# Patient Record
Sex: Male | Born: 1985 | Race: Black or African American | Hispanic: No | Marital: Single | Smoking: Current every day smoker
Health system: Southern US, Community
[De-identification: ages and names within clinical notes are randomized; demographics above are authoritative.]

## PROBLEM LIST (undated history)

## (undated) DIAGNOSIS — J45909 Unspecified asthma, uncomplicated: Secondary | ICD-10-CM

## (undated) DIAGNOSIS — G40909 Epilepsy, unspecified, not intractable, without status epilepticus: Secondary | ICD-10-CM

---

## 2006-12-09 ENCOUNTER — Emergency Department: Payer: Self-pay | Admitting: Emergency Medicine

## 2007-11-11 ENCOUNTER — Emergency Department: Payer: Self-pay | Admitting: Emergency Medicine

## 2017-09-30 ENCOUNTER — Other Ambulatory Visit: Payer: Self-pay

## 2017-09-30 ENCOUNTER — Emergency Department: Payer: Self-pay

## 2017-09-30 ENCOUNTER — Ambulatory Visit
Admission: EM | Admit: 2017-09-30 | Discharge: 2017-09-30 | Disposition: A | Discharge status: Other Acute Inpatient Hospital | Payer: Self-pay | Attending: Family Medicine | Admitting: Family Medicine

## 2017-09-30 ENCOUNTER — Encounter: Payer: Self-pay | Admitting: Emergency Medicine

## 2017-09-30 ENCOUNTER — Inpatient Hospital Stay
Admission: EM | Admit: 2017-09-30 | Discharge: 2017-10-02 | DRG: 202 | Disposition: A | Discharge status: Home / Self Care | Payer: Self-pay | Attending: Internal Medicine | Admitting: Internal Medicine

## 2017-09-30 DIAGNOSIS — F17210 Nicotine dependence, cigarettes, uncomplicated: Secondary | ICD-10-CM | POA: Diagnosis present

## 2017-09-30 DIAGNOSIS — Z9114 Patient's other noncompliance with medication regimen: Secondary | ICD-10-CM

## 2017-09-30 DIAGNOSIS — J9601 Acute respiratory failure with hypoxia: Secondary | ICD-10-CM | POA: Diagnosis present

## 2017-09-30 DIAGNOSIS — J4521 Mild intermittent asthma with (acute) exacerbation: Secondary | ICD-10-CM

## 2017-09-30 DIAGNOSIS — J45901 Unspecified asthma with (acute) exacerbation: Secondary | ICD-10-CM | POA: Diagnosis present

## 2017-09-30 DIAGNOSIS — G40909 Epilepsy, unspecified, not intractable, without status epilepticus: Secondary | ICD-10-CM | POA: Diagnosis present

## 2017-09-30 DIAGNOSIS — J189 Pneumonia, unspecified organism: Secondary | ICD-10-CM

## 2017-09-30 DIAGNOSIS — J4542 Moderate persistent asthma with status asthmaticus: Principal | ICD-10-CM | POA: Diagnosis present

## 2017-09-30 HISTORY — DX: Unspecified asthma, uncomplicated: J45.909

## 2017-09-30 HISTORY — DX: Epilepsy, unspecified, not intractable, without status epilepticus: G40.909

## 2017-09-30 LAB — CBC WITH DIFFERENTIAL/PLATELET
BASOS PCT: 1 %
Basophils Absolute: 0.1 10*3/uL (ref 0–0.1)
Eosinophils Absolute: 0.1 10*3/uL (ref 0–0.7)
Eosinophils Relative: 1 %
HEMATOCRIT: 44.4 % (ref 40.0–52.0)
HEMOGLOBIN: 14.8 g/dL (ref 13.0–18.0)
LYMPHS ABS: 1.1 10*3/uL (ref 1.0–3.6)
LYMPHS PCT: 9 %
MCH: 30.5 pg (ref 26.0–34.0)
MCHC: 33.4 g/dL (ref 32.0–36.0)
MCV: 91.3 fL (ref 80.0–100.0)
MONO ABS: 1 10*3/uL (ref 0.2–1.0)
MONOS PCT: 9 %
NEUTROS ABS: 9.9 10*3/uL — AB (ref 1.4–6.5)
NEUTROS PCT: 80 %
Platelets: 247 10*3/uL (ref 150–440)
RBC: 4.87 MIL/uL (ref 4.40–5.90)
RDW: 12.5 % (ref 11.5–14.5)
WBC: 12.2 10*3/uL — ABNORMAL HIGH (ref 3.8–10.6)

## 2017-09-30 LAB — COMPREHENSIVE METABOLIC PANEL
ALBUMIN: 4.6 g/dL (ref 3.5–5.0)
ALT: 30 U/L (ref 17–63)
ANION GAP: 9 (ref 5–15)
AST: 32 U/L (ref 15–41)
Alkaline Phosphatase: 85 U/L (ref 38–126)
BUN: 12 mg/dL (ref 6–20)
CHLORIDE: 98 mmol/L — AB (ref 101–111)
CO2: 28 mmol/L (ref 22–32)
Calcium: 9.1 mg/dL (ref 8.9–10.3)
Creatinine, Ser: 1.04 mg/dL (ref 0.61–1.24)
GFR calc non Af Amer: >60 mL/min (ref >60)
GLUCOSE: 148 mg/dL — AB (ref 65–99)
POTASSIUM: 3.7 mmol/L (ref 3.5–5.1)
Sodium: 135 mmol/L (ref 135–145)
Total Bilirubin: 0.6 mg/dL (ref 0.3–1.2)
Total Protein: 8.5 g/dL — ABNORMAL HIGH (ref 6.5–8.1)

## 2017-09-30 LAB — CBC
HCT: 40.5 % (ref 40.0–52.0)
Hemoglobin: 14.2 g/dL (ref 13.0–18.0)
MCH: 31.7 pg (ref 26.0–34.0)
MCHC: 35 g/dL (ref 32.0–36.0)
MCV: 90.5 fL (ref 80.0–100.0)
PLATELETS: 212 10*3/uL (ref 150–440)
RBC: 4.47 MIL/uL (ref 4.40–5.90)
RDW: 12.3 % (ref 11.5–14.5)
WBC: 11.1 10*3/uL — ABNORMAL HIGH (ref 3.8–10.6)

## 2017-09-30 LAB — CREATININE, SERUM
Creatinine, Ser: 0.94 mg/dL (ref 0.61–1.24)
GFR calc Af Amer: >60 mL/min (ref >60)
GFR calc non Af Amer: >60 mL/min (ref >60)

## 2017-09-30 MED ORDER — BUDESONIDE 0.5 MG/2ML IN SUSP
0.5000 mg | Freq: Two times a day (BID) | RESPIRATORY_TRACT | Status: DC
  Administered 2017-09-30 – 2017-10-02 (×4): 0.5 mg via RESPIRATORY_TRACT
  Filled 2017-09-30 (×4): qty 2

## 2017-09-30 MED ORDER — BENZONATATE 100 MG PO CAPS: 100.0000 mg | ORAL_CAPSULE | Freq: Three times a day (TID) | ORAL | Status: DC | PRN

## 2017-09-30 MED ORDER — IPRATROPIUM-ALBUTEROL 0.5-2.5 (3) MG/3ML IN SOLN
3.0000 mL | Freq: Four times a day (QID) | RESPIRATORY_TRACT | Status: DC
  Administered 2017-09-30: 3 mL via RESPIRATORY_TRACT

## 2017-09-30 MED ORDER — SODIUM CHLORIDE 0.9 % IV BOLUS
1000.0000 mL | Freq: Once | INTRAVENOUS | Status: AC
  Administered 2017-09-30: 1000 mL via INTRAVENOUS

## 2017-09-30 MED ORDER — MAGNESIUM SULFATE 2 GM/50ML IV SOLN
2.0000 g | Freq: Once | INTRAVENOUS | Status: AC
  Administered 2017-09-30: 2 g via INTRAVENOUS

## 2017-09-30 MED ORDER — ORAL CARE MOUTH RINSE
15.0000 mL | Freq: Two times a day (BID) | OROMUCOSAL | Status: DC
  Administered 2017-09-30 – 2017-10-01 (×3): 15 mL via OROMUCOSAL

## 2017-09-30 MED ORDER — ACETAMINOPHEN 650 MG RE SUPP: 650.0000 mg | Freq: Four times a day (QID) | RECTAL | Status: DC | PRN

## 2017-09-30 MED ORDER — ONDANSETRON HCL 4 MG PO TABS: 4.0000 mg | ORAL_TABLET | Freq: Four times a day (QID) | ORAL | Status: DC | PRN

## 2017-09-30 MED ORDER — AZITHROMYCIN 500 MG PO TABS
500.0000 mg | ORAL_TABLET | Freq: Once | ORAL | Status: AC
  Administered 2017-09-30: 500 mg via ORAL
  Filled 2017-09-30: qty 1

## 2017-09-30 MED ORDER — METHYLPREDNISOLONE SODIUM SUCC 40 MG IJ SOLR: 40.0000 mg | Freq: Four times a day (QID) | INTRAMUSCULAR | Status: DC

## 2017-09-30 MED ORDER — ACETAMINOPHEN 325 MG PO TABS: 650.0000 mg | ORAL_TABLET | Freq: Four times a day (QID) | ORAL | Status: DC | PRN

## 2017-09-30 MED ORDER — ACETAMINOPHEN 500 MG PO TABS
1000.0000 mg | ORAL_TABLET | Freq: Once | ORAL | Status: AC
  Administered 2017-09-30: 1000 mg via ORAL
  Filled 2017-09-30: qty 2

## 2017-09-30 MED ORDER — ALBUTEROL SULFATE (2.5 MG/3ML) 0.083% IN NEBU: 5.0000 mg | INHALATION_SOLUTION | Freq: Once | RESPIRATORY_TRACT | Status: DC

## 2017-09-30 MED ORDER — ONDANSETRON HCL 4 MG/2ML IJ SOLN: 4.0000 mg | Freq: Four times a day (QID) | INTRAMUSCULAR | Status: DC | PRN

## 2017-09-30 MED ORDER — METHYLPREDNISOLONE SODIUM SUCC 125 MG IJ SOLR
125.0000 mg | Freq: Once | INTRAMUSCULAR | Status: AC
  Administered 2017-09-30: 125 mg via INTRAVENOUS
  Filled 2017-09-30: qty 2

## 2017-09-30 MED ORDER — IPRATROPIUM-ALBUTEROL 0.5-2.5 (3) MG/3ML IN SOLN
3.0000 mL | Freq: Four times a day (QID) | RESPIRATORY_TRACT | Status: DC
  Administered 2017-09-30 – 2017-10-01 (×4): 3 mL via RESPIRATORY_TRACT
  Filled 2017-09-30 (×4): qty 3

## 2017-09-30 MED ORDER — IPRATROPIUM-ALBUTEROL 0.5-2.5 (3) MG/3ML IN SOLN
3.0000 mL | Freq: Once | RESPIRATORY_TRACT | Status: AC
  Administered 2017-09-30: 3 mL via RESPIRATORY_TRACT

## 2017-09-30 MED ORDER — NICOTINE 21 MG/24HR TD PT24
21.0000 mg | MEDICATED_PATCH | Freq: Every day | TRANSDERMAL | Status: DC
  Administered 2017-09-30 – 2017-10-02 (×3): 21 mg via TRANSDERMAL
  Filled 2017-09-30 (×4): qty 1

## 2017-09-30 MED ORDER — ALBUTEROL SULFATE (2.5 MG/3ML) 0.083% IN NEBU
10.0000 mg/h | INHALATION_SOLUTION | Freq: Once | RESPIRATORY_TRACT | Status: DC
  Filled 2017-09-30: qty 3

## 2017-09-30 MED ORDER — ALBUTEROL SULFATE (2.5 MG/3ML) 0.083% IN NEBU
2.5000 mg | INHALATION_SOLUTION | Freq: Once | RESPIRATORY_TRACT | Status: AC
  Administered 2017-09-30: 2.5 mg via RESPIRATORY_TRACT

## 2017-09-30 MED ORDER — METHYLPREDNISOLONE SODIUM SUCC 40 MG IJ SOLR
40.0000 mg | Freq: Four times a day (QID) | INTRAMUSCULAR | Status: DC
  Administered 2017-09-30 – 2017-10-01 (×4): 40 mg via INTRAVENOUS
  Filled 2017-09-30 (×4): qty 1

## 2017-09-30 MED ORDER — METHYLPREDNISOLONE SODIUM SUCC 40 MG IJ SOLR
80.0000 mg | Freq: Once | INTRAMUSCULAR | Status: AC
Start: 2017-09-30 — End: 2017-09-30
  Administered 2017-09-30: 80 mg via INTRAMUSCULAR

## 2017-09-30 MED ORDER — ENOXAPARIN SODIUM 40 MG/0.4ML ~~LOC~~ SOLN
40.0000 mg | SUBCUTANEOUS | Status: DC
  Administered 2017-09-30: 40 mg via SUBCUTANEOUS
  Filled 2017-09-30: qty 0.4

## 2017-09-30 MED ORDER — IPRATROPIUM-ALBUTEROL 0.5-2.5 (3) MG/3ML IN SOLN: 3.0000 mL | Freq: Once | RESPIRATORY_TRACT | Status: DC

## 2017-09-30 NOTE — ED Provider Notes (Signed)
[facility]    CSN: [id number] Arrival date & time: 09/30/17  1250   History   Chief Complaint Chief Complaint  Patient presents with  . Shortness of Breath   HPI  32 year old male presents with shortness of breath and wheezing.  Patient has a known history of asthma.  He is been out of his asthma medications for quite some time.  He states that he has not had any trouble with asthma in years.  He reports a 2-day history of shortness of breath, cough, wheezing.  Cough is productive.  No reports of fever.  No known inciting factor.  No known exacerbating or relieving factors.  No other associated symptoms.  No other complaints.  Past Medical History:  Diagnosis Date  . Asthma   . Epilepsy (HCC)    Surgical hx - No past surgeries.  Home Medications    Prior to Admission medications   Not on File   Family History No reported family hx.  Social History Social History   Tobacco Use  . Smoking status: Current Every Day Smoker    Packs/day: 1.00    Types: Cigarettes  . Smokeless tobacco: Never Used  Substance Use Topics  . Alcohol use: Yes    Comment: several times weekly  . Drug use: Yes    Types: Marijuana    Comment: last use 4 days ago   Allergies   Patient has no known allergies.  Review of Systems Review of Systems  Constitutional: Negative for chills and fever.  Respiratory: Positive for cough, shortness of breath and wheezing.    Physical Exam Triage Vital Signs ED Triage Vitals  Enc Vitals Group     BP 09/30/17 1306 137/89     Pulse Rate 09/30/17 1306 85     Resp 09/30/17 1306 (!) 30     Temp 09/30/17 1306 99.3 F (37.4 C)     Temp Source 09/30/17 1306 Oral     SpO2 09/30/17 1306 (!) 89 %     Weight 09/30/17 1307 220 lb (99.8 kg)     Height 09/30/17 1307  (1.753 m)     Head Circumference --      Peak Flow --      Pain Score 09/30/17 1307 0     Pain Loc --      Pain Edu? --      Excl. in GC? --    Updated Vital Signs BP  137/89 (BP Location: Left Arm)   Pulse 85   Temp 99.3 F (37.4 C) (Oral)   Resp (!) 30   Ht  (1.753 m)   Wt 220 lb (99.8 kg)   SpO2 92%   BMI 32.49 kg/m   Physical Exam  Constitutional: He is oriented to person, place, and time. He appears well-developed. No distress.  HENT:  Head: Normocephalic and atraumatic.  Mouth/Throat: Oropharynx is clear and moist.  Eyes: Conjunctivae are normal. Right eye exhibits no discharge. Left eye exhibits no discharge.  Cardiovascular: Normal rate and regular rhythm.  Pulmonary/Chest: No respiratory distress.  Diffuse expiratory wheezing.  Neurological: He is alert and oriented to person, place, and time.  Psychiatric: He has a normal mood and affect. His behavior is normal.  Nursing note and vitals reviewed.  UC Treatments / Results  Labs (all labs ordered are listed, but only abnormal results are displayed) Labs Reviewed - No data to display  EKG None  Radiology No results found.  Procedures Procedures (including critical  care time)  Medications Ordered in UC Medications  ipratropium-albuterol (DUONEB) 0.5-2.5 (3) MG/3ML nebulizer solution 3 mL (3 mLs Nebulization Given 09/30/17 1320)  albuterol (PROVENTIL) (2.5 MG/3ML) 0.083% nebulizer solution 2.5 mg (2.5 mg Nebulization Given 09/30/17 1339)  methylPREDNISolone sodium succinate (SOLU-MEDROL) 40 mg/mL injection 80 mg (80 mg Intramuscular Given 09/30/17 1340)    Initial Impression / Assessment and Plan / UC Course  I have reviewed the triage vital signs and the nursing notes.  Pertinent labs & imaging results that were available during my care of the patient were reviewed by me and considered in my medical decision making (see chart for details).    32 year old male presents with an asthma exacerbation.  Hypoxic on initial exam.  Had improvement briefly with a DuoNeb.  Additional breathing treatment was given.  Also was given Solu-Medrol.  Patient remained hypoxic and required  oxygen.  Patient going via EMS to the hospital.  Final Clinical Impressions(s) / UC Diagnoses   Final diagnoses:  Mild intermittent asthma with acute exacerbation   Controlled Substance Prescriptions [place] Controlled Substance Registry consulted? Not Applicable   Tommie Sams, DO 09/30/17 1437

## 2017-09-30 NOTE — ED Notes (Signed)
Patient transported to room 120 by this EDT.

## 2017-09-30 NOTE — Plan of Care (Signed)
Pt admitted from the ED. VSS. Dyspnea with exertion. Continue on 2L O2 per [place] with O2 sats 93%. Denies pain.

## 2017-09-30 NOTE — ED Triage Notes (Signed)
Pt with 2 days of SOB, cough and wheezing. He is out of his asthma medications

## 2017-09-30 NOTE — H&P (Signed)
[employer] [facility] [facility] [place] at [facility]   PATIENT NAME: Eric Prince    MR#:  [id number]  DATE OF BIRTH:  [birth year]  DATE OF ADMISSION:  09/30/2017  PRIMARY CARE PHYSICIAN: Patient, No Pcp Per   REQUESTING/REFERRING PHYSICIAN: Dr. Merrily Brittle  CHIEF COMPLAINT:   Chief Complaint  Patient presents with  . Shortness of Breath    HISTORY OF PRESENT ILLNESS:  Eric Prince  is a 32 y.o. male with a known history of asthma, epilepsy who presents to the hospital due to worsening shortness of breath, cough.  Patient says that he has not been feeling well for the past 2 to 3 days.  He has significant shortness of breath just on minimal exertion.  He presented to the hospital was noted to be in asthma exacerbation also noted to be hypoxic with O2 sats in the low 80s.  Patient was given duo nebs, Solu-Medrol, and feels a bit better.  But still complaining of significant shortness of breath on minimal exertion.  Hospitalist services were therefore contacted for treatment evaluation.  Pt. Denies Any fevers, chills, sick contacts.  He does admit to chest tightness with coughing and also with inspiration.  Patient denies any other associated symptoms presently.  PAST MEDICAL HISTORY:   Past Medical History:  Diagnosis Date  . Asthma   . Epilepsy (HCC)     PAST SURGICAL HISTORY:  History reviewed. No pertinent surgical history.  SOCIAL HISTORY:   Social History   Tobacco Use  . Smoking status: Current Every Day Smoker    Packs/day: 1.00    Years: 10.00    Pack years: 10.00    Types: Cigarettes  . Smokeless tobacco: Never Used  Substance Use Topics  . Alcohol use: Yes    Comment: several times weekly    FAMILY HISTORY:  History reviewed. No pertinent family history.  DRUG ALLERGIES:  No Known Allergies  REVIEW OF SYSTEMS:   Review of Systems  Constitutional: Negative for fever and weight loss.  HENT: Negative for congestion, nosebleeds and tinnitus.    Eyes: Negative for blurred vision, double vision and redness.  Respiratory: Positive for shortness of breath and wheezing. Negative for cough and hemoptysis.   Cardiovascular: Negative for chest pain, orthopnea, leg swelling and PND.  Gastrointestinal: Negative for abdominal pain, diarrhea, melena, nausea and vomiting.  Genitourinary: Negative for dysuria, hematuria and urgency.  Musculoskeletal: Negative for falls and joint pain.  Neurological: Negative for dizziness, tingling, sensory change, focal weakness, seizures, weakness and headaches.  Endo/Heme/Allergies: Negative for polydipsia. Does not bruise/bleed easily.  Psychiatric/Behavioral: Negative for depression and memory loss. The patient is not nervous/anxious.     MEDICATIONS AT HOME:   Prior to Admission medications   Not on File      VITAL SIGNS:  Blood pressure (!) 155/76, pulse 95, temperature (!) 100.7 F (38.2 C), temperature source Oral, resp. rate 18, height  (1.753 m), weight 99.8 kg (220 lb), SpO2 92 %.  PHYSICAL EXAMINATION:  Physical Exam  GENERAL:  32 y.o.-year-old patient lying in the bed in mild Resp. Distress.   EYES: Pupils equal, round, reactive to light and accommodation. No scleral icterus. Extraocular muscles intact.  HEENT: Head atraumatic, normocephalic. Oropharynx and nasopharynx clear. No oropharyngeal erythema, moist oral mucosa  NECK:  Supple, no jugular venous distention. No thyroid enlargement, no tenderness.  LUNGS: Diffuse inspiratory and expiratory wheezing.  No use of accessory muscles, no rales, rhonchi. CARDIOVASCULAR: S1, S2 RRR. No murmurs, rubs, gallops,  clicks.  ABDOMEN: Soft, nontender, nondistended. Bowel sounds present. No organomegaly or mass.  EXTREMITIES: No pedal edema, cyanosis, or clubbing. + 2 pedal & radial pulses b/l.   NEUROLOGIC: Cranial nerves II through XII are intact. No focal Motor or sensory deficits appreciated b/l PSYCHIATRIC: The patient is alert and  oriented x 3. Good affect.  SKIN: No obvious rash, lesion, or ulcer.   LABORATORY PANEL:   CBC Recent Labs  Lab 09/30/17 1450  WBC 12.2*  HGB 14.8  HCT 44.4  PLT 247   ------------------------------------------------------------------------------------------------------------------  Chemistries  Recent Labs  Lab 09/30/17 1450  NA 135  K 3.7  CL 98*  CO2 28  GLUCOSE 148*  BUN 12  CREATININE 1.04  CALCIUM 9.1  AST 32  ALT 30  ALKPHOS 85  BILITOT 0.6   ------------------------------------------------------------------------------------------------------------------  Cardiac Enzymes No results for input(s): TROPONINI in the last 168 hours. ------------------------------------------------------------------------------------------------------------------  RADIOLOGY:  Dg Chest Port 1 View  Result Date: 09/30/2017 CLINICAL DATA:  Two days of shortness of breath, cough, and wheezing. The patient has run out of his asthma medicines. The patient is a current smoker. EXAM: PORTABLE CHEST 1 VIEW COMPARISON:  Chest x-ray of December 10, 2006 FINDINGS: The lungs are well-expanded the interstitial markings are coarse. There is no alveolar infiltrate. The heart and pulmonary vascularity are normal. The mediastinum is normal in width. There is no pleural effusion. The bony thorax exhibits no acute abnormality. IMPRESSION: Mild interstitial marking prominence consistent with the history of reactive airway disease and smoking. No alveolar pneumonia nor CHF. Electronically Signed   By: David  [place] M.D.   On: 09/30/2017 15:14     IMPRESSION AND PLAN:   32 year old male with past medical history of asthma, epilepsy who presents to the hospital due to shortness of breath cough wheezing and noted to be an asthma exacerbation.  1.  Acute respiratory failure with hypoxia-secondary to asthma exacerbation. - Continue O2 supplementation, will treat underlying asthma with IV steroids, scheduled duo  nebs, Pulmicort nebs.  2.  Asthma exacerbation-this is a cause of patient's worsening respiratory failure with hypoxia. -This is secondary to ongoing tobacco abuse and possible underlying bronchitis.  Will place patient on IV steroids, scheduled duo nebs, Pulmicort nebs. -Patient will need to be on albuterol rescue inhaler and probably maintenance inhaler for asthma prior to discharge.  All the records are reviewed and case discussed with ED provider. Management plans discussed with the patient, family and they are in agreement.  CODE STATUS: Full code  TOTAL TIME TAKING CARE OF THIS PATIENT: 40 minutes.    [place] Siren M.D on 09/30/2017 at 4:07 PM  Between 7am to 6pm - Pager - [phone]  After 6pm go to [email or web address] - password [email or web address]  [facility]  Office  [phone]  CC: Primary care physician; Patient, No Pcp Per

## 2017-09-30 NOTE — ED Provider Notes (Signed)
[facility] Emergency Department Provider Note  ____________________________________________   First MD Initiated Contact with Patient 09/30/17 1446     (approximate)  I have reviewed the triage vital signs and the nursing notes.   HISTORY  Chief Complaint Shortness of Breath    HPI Eric Prince is a 32 y.o. male who comes to the emergency department via EMS with several days of shortness of breath.  He has a past medical history of asthma and has been noncompliant with all of his medications for "sometime".  He does smoke cigarettes.  He has had asthma since a child.  He has never required inpatient hospitalization or endotracheal intubation.  For the past 2 days he has had increased thick green sputum production.  Today he went to urgent care who gave him 80 mg of Solu-Medrol intramuscularly as well as 2 breathing treatments and noted the patient was hypoxic and called 911.  His shortness of breath had insidious onset slowly progressive is now moderate to severe.  Somewhat improved with albuterol somewhat worsened with exertion.  Past Medical History:  Diagnosis Date  . Asthma   . Epilepsy (HCC)     There are no active problems to display for this patient.   History reviewed. No pertinent surgical history.  Prior to Admission medications   Not on File    Allergies Patient has no known allergies.  History reviewed. No pertinent family history.  Social History Social History   Tobacco Use  . Smoking status: Current Every Day Smoker    Packs/day: 1.00    Types: Cigarettes  . Smokeless tobacco: Never Used  Substance Use Topics  . Alcohol use: Yes    Comment: several times weekly  . Drug use: Yes    Types: Marijuana    Comment: last use 4 days ago    Review of Systems Constitutional: Positive for fever Eyes: No visual changes. ENT: No sore throat. Cardiovascular: Positive for chest pain. Respiratory: Positive for shortness of  breath. Gastrointestinal: No abdominal pain.  No nausea, no vomiting.  No diarrhea.  No constipation. Genitourinary: Negative for dysuria. Musculoskeletal: Negative for back pain. Skin: Negative for rash. Neurological: Negative for headaches, focal weakness or numbness.   ____________________________________________   PHYSICAL EXAM:  VITAL SIGNS: ED Triage Vitals  Enc Vitals Group     BP 09/30/17 1501 (!) 155/110     Pulse Rate 09/30/17 1501 95     Resp 09/30/17 1501 (!) 28     Temp 09/30/17 1501 (!) 100.7 F (38.2 C)     Temp Source 09/30/17 1501 Oral     SpO2 09/30/17 1501 91 %     Weight 09/30/17 1448 220 lb (99.8 kg)     Height 09/30/17 1448  (1.753 m)     Head Circumference --      Peak Flow --      Pain Score 09/30/17 1448 10     Pain Loc --      Pain Edu? --      Excl. in GC? --     Constitutional: Alert and oriented x4 appears uncomfortable in moderate respiratory distress using accessory muscles speaking in short gasps Eyes: PERRL EOMI. Head: Atraumatic. Nose: No congestion/rhinnorhea. Mouth/Throat: No trismus Neck: No stridor.   Cardiovascular: Normal rate, regular rhythm. Grossly normal heart sounds.  Good peripheral circulation. Respiratory: Increased respiratory effort using accessory muscles with inspiratory rhonchi and expiratory wheezing with prolonged expiratory phase throughout Gastrointestinal: Soft nontender Musculoskeletal: No  lower extremity edema   Neurologic:  Normal speech and language. No gross focal neurologic deficits are appreciated. Skin:  Skin is warm, dry and intact. No rash noted. Psychiatric: Anxious appearing   ____________________________________________   DIFFERENTIAL includes but not limited to  Asthma exacerbation, pneumothorax, pulmonary embolism, pneumonia, upper respiratory tract infection ____________________________________________   LABS (all labs ordered are listed, but only abnormal results are  displayed)  Labs Reviewed  COMPREHENSIVE METABOLIC PANEL - Abnormal; Notable for the following components:      Result Value   Chloride 98 (*)    Glucose, Bld 148 (*)    Total Protein 8.5 (*)    All other components within normal limits  CBC WITH DIFFERENTIAL/PLATELET - Abnormal; Notable for the following components:   WBC 12.2 (*)    Neutro Abs 9.9 (*)    All other components within normal limits  BLOOD GAS, VENOUS    Lab work reviewed by me shows elevated white count which is nonspecific __________________________________________  EKG   ____________________________________________  RADIOLOGY  Chest x-ray reviewed by me with some mild interstitial prominence ____________________________________________   PROCEDURES  Procedure(s) performed: no  .Critical Care Performed by: Merrily Brittle, MD Authorized by: Merrily Brittle, MD   Critical care provider statement:    Critical care time (minutes):  30   Critical care time was exclusive of:  Separately billable procedures and treating other patients   Critical care was necessary to treat or prevent imminent or life-threatening deterioration of the following conditions:  Respiratory failure   Critical care was time spent personally by me on the following activities:  Development of treatment plan with patient or surrogate, discussions with consultants, evaluation of patient's response to treatment, examination of patient, obtaining history from patient or surrogate, ordering and performing treatments and interventions, ordering and review of laboratory studies, ordering and review of radiographic studies, pulse oximetry, re-evaluation of patient's condition and review of old charts    Critical Care performed: Yes  Observation: no ____________________________________________   INITIAL IMPRESSION / ASSESSMENT AND PLAN / ED COURSE  Pertinent labs & imaging results that were available during my care of the patient were  reviewed by me and considered in my medical decision making (see chart for details).  The patient arrives via EMS quite short of breath.  He has inspiratory rhonchi and expiratory wheezing throughout.  Given 80 mg of intramuscular Solu-Medrol in clinic along with 2 breathing treatment so I will give him 2 more duo nebs now along with 2 g of mag rapid bolus and fluids and reevaluate.  Chest x-ray pending given his history of thick productive sputum.    ----------------------------------------- 3:52 PM on 09/30/2017 -----------------------------------------  After 4 breathing treatments, 2 g of magnesium, and several hours after his initial dose of intramuscular steroids the patient does feel improved although his lungs are still more rhonchorous than wheezy.  I got him up and ambulated him up and down the department and he was quite short of breath.  Following this his oxygen saturation was 81% on room air.  At this point given the patient's continued oxygen requirements he will require inpatient admission for steroids, and antibiotics for probable interstitial pneumonia, and continued bronchodilation.  I discussed with the patient who verbalized understanding and agreement with the plan.  I then discussed with the hospitalist who has graciously agreed to admit the patient to his service.  ____________________________________________   FINAL CLINICAL IMPRESSION(S) / ED DIAGNOSES  Final diagnoses:  Moderate persistent asthma with  status asthmaticus  Community acquired pneumonia, unspecified laterality      NEW MEDICATIONS STARTED DURING THIS VISIT:  New Prescriptions   No medications on file     Note:  This document was prepared using Dragon voice recognition software and may include unintentional dictation errors.     Merrily Brittle, MD 09/30/17 1558

## 2017-09-30 NOTE — ED Notes (Signed)
Per MD given Mag over 15 mins

## 2017-09-30 NOTE — ED Triage Notes (Signed)
Pt presents to ED via [facility] with c/o asthma. Pt seen at [facility], O2 sats 88% on RA, 94% on 6L via [place]. Pt states out of asthma medications. Pt received 1 duoneb, 1 albuterol,  IM solu-medrol, prednisone at [facility]. Pt also received albuterol en route. Pt with 18g to R AC.

## 2017-10-01 LAB — CBC
HCT: 40.6 % (ref 40.0–52.0)
HEMOGLOBIN: 13.6 g/dL (ref 13.0–18.0)
MCH: 30.7 pg (ref 26.0–34.0)
MCHC: 33.4 g/dL (ref 32.0–36.0)
MCV: 91.9 fL (ref 80.0–100.0)
Platelets: 237 10*3/uL (ref 150–440)
RBC: 4.42 MIL/uL (ref 4.40–5.90)
RDW: 12.7 % (ref 11.5–14.5)
WBC: 12.8 10*3/uL — ABNORMAL HIGH (ref 3.8–10.6)

## 2017-10-01 MED ORDER — PREDNISONE 50 MG PO TABS
50.0000 mg | ORAL_TABLET | Freq: Every day | ORAL | Status: DC
Start: 2017-10-02 — End: 2017-10-02
  Administered 2017-10-02: 09:00:00 50 mg via ORAL
  Filled 2017-10-01: qty 1

## 2017-10-01 MED ORDER — IPRATROPIUM-ALBUTEROL 0.5-2.5 (3) MG/3ML IN SOLN: 3.0000 mL | RESPIRATORY_TRACT | Status: DC | PRN

## 2017-10-01 NOTE — Progress Notes (Signed)
[employer] - [place] at [facility]   PATIENT NAME: Eric Prince    MR#:  [id number]  DATE OF BIRTH:  Jun 20, 1985  SUBJECTIVE:  CHIEF COMPLAINT:   Chief Complaint  Patient presents with  . Shortness of Breath   Better SOB and wheezing. On O2 [place] 2L this am. REVIEW OF SYSTEMS:  Review of Systems  Constitutional: Negative for chills, fever and malaise/fatigue.  HENT: Negative for sore throat.   Eyes: Negative for blurred vision and double vision.  Respiratory: Positive for cough, shortness of breath and wheezing. Negative for hemoptysis and stridor.   Cardiovascular: Negative for chest pain, palpitations, orthopnea and leg swelling.  Gastrointestinal: Negative for abdominal pain, blood in stool, diarrhea, melena, nausea and vomiting.  Genitourinary: Negative for dysuria, flank pain and hematuria.  Musculoskeletal: Negative for back pain and joint pain.  Skin: Negative for rash.  Neurological: Negative for dizziness, sensory change, focal weakness, seizures, loss of consciousness, weakness and headaches.  Endo/Heme/Allergies: Negative for polydipsia.  Psychiatric/Behavioral: Negative for depression. The patient is not nervous/anxious.     DRUG ALLERGIES:  No Known Allergies VITALS:  Blood pressure (!) 144/76, pulse 75, temperature 97.8 F (36.6 C), temperature source Oral, resp. rate 18, height  (1.753 m), weight 220 lb (99.8 kg), SpO2 90 %. PHYSICAL EXAMINATION:  Physical Exam  Constitutional: He is oriented to person, place, and time. He appears well-nourished.  HENT:  Head: Normocephalic.  Mouth/Throat: Oropharynx is clear and moist.  Eyes: Pupils are equal, round, and reactive to light. Conjunctivae and EOM are normal. No scleral icterus.  Neck: Normal range of motion. Neck supple. No JVD present. No tracheal deviation present.  Cardiovascular: Normal rate, regular rhythm and normal heart sounds. Exam reveals no gallop.  No murmur  heard. Pulmonary/Chest: Effort normal and breath sounds normal. No respiratory distress. He has no wheezes. He has no rales.  Abdominal: Soft. Bowel sounds are normal. He exhibits no distension. There is no tenderness. There is no rebound.  Musculoskeletal: Normal range of motion. He exhibits no edema or tenderness.  Neurological: He is alert and oriented to person, place, and time. No cranial nerve deficit.  Skin: No rash noted. No erythema.  Psychiatric: He has a normal mood and affect.   LABORATORY PANEL:  Male CBC Recent Labs  Lab 10/01/17 0531  WBC 12.8*  HGB 13.6  HCT 40.6  PLT 237   ------------------------------------------------------------------------------------------------------------------ Chemistries  Recent Labs  Lab 09/30/17 1450 09/30/17 1924  NA 135  --   K 3.7  --   CL 98*  --   CO2 28  --   GLUCOSE 148*  --   BUN 12  --   CREATININE 1.04 0.94  CALCIUM 9.1  --   AST 32  --   ALT 30  --   ALKPHOS 85  --   BILITOT 0.6  --    RADIOLOGY:  No results found. ASSESSMENT AND PLAN:   32 year old male with past medical history of asthma, epilepsy who presents to the hospital due to shortness of breath cough wheezing and noted to be an asthma exacerbation.  1.  Acute respiratory failure with hypoxia-secondary to asthma exacerbation. - Continue O2 supplementation, IV steroids, scheduled duo nebs, Pulmicort nebs. -Patient will need to be on albuterol rescue inhaler and probably maintenance inhaler for asthma prior to discharge.  2. Tobacco use. Smoking cessation was counseled for 4-5 minutes.  All the records are reviewed and case discussed with Care Management/Social Worker.  Management plans discussed with the patient, family and they are in agreement.  CODE STATUS: Full Code  TOTAL TIME TAKING CARE OF THIS PATIENT: 33 minutes.   More than 50% of the time was spent in counseling/coordination of care: YES  POSSIBLE D/C IN 1-2 DAYS, DEPENDING ON  CLINICAL CONDITION.   Shaune Pollack M.D on 10/01/2017 at 4:45 PM  Between 7am to 6pm - Pager - [phone]  After 6pm go to [email or web address] - Therapist, nutritional Hospitalists

## 2017-10-01 NOTE — Progress Notes (Signed)
Chaplain responded to an OR for an AD. Pt was awakening and said he will review. Chaplain educated Pt and sign. Other about AD. They will page when and if ready.    10/01/17 1000  Clinical Encounter Type  Visited With Patient and family together  Visit Type Initial  Referral From Nurse  Spiritual Encounters  Spiritual Needs Brochure

## 2017-10-01 NOTE — Plan of Care (Signed)
  Problem: Education: Goal: Knowledge of General Education information will improve Outcome: Progressing   Problem: Health Behavior/Discharge Planning: Goal: Ability to manage health-related needs will improve Outcome: Progressing   Problem: Clinical Measurements: Goal: Ability to maintain clinical measurements within normal limits will improve Outcome: Progressing   Problem: Nutrition: Goal: Adequate nutrition will be maintained Outcome: Progressing   Problem: Coping: Goal: Level of anxiety will decrease Outcome: Progressing   Problem: Elimination: Goal: Will not experience complications related to bowel motility Outcome: Progressing Goal: Will not experience complications related to urinary retention Outcome: Progressing   Problem: Pain Managment: Goal: General experience of comfort will improve Outcome: Progressing   Problem: Safety: Goal: Ability to remain free from injury will improve Outcome: Progressing

## 2017-10-02 LAB — HIV ANTIBODY (ROUTINE TESTING W REFLEX): HIV Screen 4th Generation wRfx: NONREACTIVE

## 2017-10-02 MED ORDER — ALBUTEROL SULFATE HFA 108 (90 BASE) MCG/ACT IN AERS: 2.0000 | INHALATION_SPRAY | Freq: Four times a day (QID) | RESPIRATORY_TRACT | 2 refills | Status: AC | PRN

## 2017-10-02 MED ORDER — PREDNISONE 10 MG (21) PO TBPK: ORAL_TABLET | ORAL | 0 refills | Status: DC

## 2017-10-02 MED ORDER — ALBUTEROL SULFATE HFA 108 (90 BASE) MCG/ACT IN AERS: 2.0000 | INHALATION_SPRAY | Freq: Four times a day (QID) | RESPIRATORY_TRACT | 2 refills | Status: DC | PRN

## 2017-10-02 MED ORDER — PREDNISONE 10 MG (21) PO TBPK: ORAL_TABLET | ORAL | 0 refills | Status: AC

## 2017-10-02 MED ORDER — BUDESONIDE-FORMOTEROL FUMARATE 80-4.5 MCG/ACT IN AERO: 2.0000 | INHALATION_SPRAY | Freq: Two times a day (BID) | RESPIRATORY_TRACT | 1 refills | Status: AC

## 2017-10-02 MED ORDER — BUDESONIDE-FORMOTEROL FUMARATE 80-4.5 MCG/ACT IN AERO: 2.0000 | INHALATION_SPRAY | Freq: Two times a day (BID) | RESPIRATORY_TRACT | 1 refills | Status: DC

## 2017-10-02 NOTE — Discharge Summary (Signed)
Shown Prince, is a 32 y.o. male  DOB 1985/09/11  MRN [id number].  Admission date:  09/30/2017  Admitting Physician  Houston Siren, MD  Discharge Date:  10/02/2017   Primary MD  Patient, No Pcp Per  Recommendations for primary care physician for things to follow:  Patient has no PCP   we are trying to set up with Dr. Daniel Nones as a new patient.   Admission Diagnosis  Moderate persistent asthma with status asthmaticus [J45.42] Community acquired pneumonia, unspecified laterality [J18.9]   Discharge Diagnosis  Moderate persistent asthma with status asthmaticus [J45.42] Community acquired pneumonia, unspecified laterality [J18.9]   Active Problems:   Asthma exacerbation      Past Medical History:  Diagnosis Date  . Asthma   . Epilepsy (HCC)     History reviewed. No pertinent surgical history.     History of present illness and  Hospital Course:     Kindly see H&P for history of present illness and admission details, please review complete Labs, Consult reports and Test reports for all details in brief  HPI  from the history and physical done on the day of admission 32 year old male patient with no past medical history except asthma came in because of wheezing, shortness of breath, O2 sat 80% and he came, admitted for acute respiratory failure with hypoxia secondary to severe asthma exacerbation.   Hospital Course  #1 acute respiratory failure secondary to severe asthma exacerbation: Patient never required intubation, received IV steroids, bronchodilators, oxygen, Pulmicort nebs.  Chest x-ray negative for pneumonia.  Patient lab work is largely normal.  Today patient wheezing improved, hypoxia also improved, O2 saturation 95% on room air.  And hemodynamically stable.  Wants to go home.  Discharged home with  albuterol inhaler, Symbicort, tapering course of prednisone.  Patient has no PCP advised the patient to follow-up with 1 of the local doctors and I will try to set up with Dr. Darryll Capers as new patient.  Patient also advised to quit smoking.     Discharge Condition: stable   Follow UP  Follow-up Information    Lynnea Ferrier, MD. Schedule an appointment as soon as possible for a visit.   Specialty:  Internal Medicine Why:  new patient Patient is 32 year old male admitted for asthma exacerbation has no PCP Contact information: [address] [facility] [place] [address] [phone]             Discharge Instructions  and  Discharge Medications   Quit smoking   Allergies as of 10/02/2017   No Known Allergies     Medication List    TAKE these medications   albuterol 108 (90 Base) MCG/ACT inhaler Commonly known as:  PROVENTIL HFA;VENTOLIN HFA Inhale 2 puffs into the lungs every 6 (six) hours as needed for wheezing or shortness of breath.   budesonide-formoterol 80-4.5 MCG/ACT inhaler Commonly known as:  SYMBICORT Inhale 2 puffs into the lungs 2 (two) times daily at 10 AM and 5 PM.   predniSONE 10 MG (21) Tbpk tablet Commonly known as:  STERAPRED UNI-PAK 21 TAB Taper by 10 mg daily         Diet and Activity recommendation: See Discharge Instructions above   Consults obtained -none   Major procedures and Radiology Reports - PLEASE review detailed and final reports for all details, in brief -     Dg Chest Port 1 View  Result Date: 09/30/2017 CLINICAL DATA:  Two days of shortness  of breath, cough, and wheezing. The patient has run out of his asthma medicines. The patient is a current smoker. EXAM: PORTABLE CHEST 1 VIEW COMPARISON:  Chest x-ray of December 10, 2006 FINDINGS: The lungs are well-expanded the interstitial markings are coarse. There is no alveolar infiltrate. The heart and pulmonary vascularity are normal. The mediastinum is  normal in width. There is no pleural effusion. The bony thorax exhibits no acute abnormality. IMPRESSION: Mild interstitial marking prominence consistent with the history of reactive airway disease and smoking. No alveolar pneumonia nor CHF. Electronically Signed   By: David  [place] M.D.   On: 09/30/2017 15:14    Micro Results     No results found for this or any previous visit (from the past 240 hour(s)).     Today   Subjective:   Eric Prince today has no headache,no chest abdominal pain,no new weakness tingling or numbness, feels much better wants to go home today.   Objective:   Blood pressure 136/84, pulse (!) 56, temperature 97.7 F (36.5 C), temperature source Oral, resp. rate 18, height  (1.753 m), weight 99.8 kg (220 lb), SpO2 95 %.  No intake or output data in the 24 hours ending 10/02/17 0835  Exam Awake Alert, Oriented x 3, No new F.N deficits, Normal affect [place].AT,PERRAL Supple Neck,No JVD, No cervical lymphadenopathy appriciated.  Symmetrical Chest wall movement, Good air movement bilaterally, CTAB RRR,No Gallops,Rubs or new Murmurs, No Parasternal Heave +ve B.Sounds, Abd Soft, Non tender, No organomegaly appriciated, No rebound -guarding or rigidity. No Cyanosis, Clubbing or edema, No new Rash or bruise  Data Review   CBC w Diff:  Lab Results  Component Value Date   WBC 12.8 (H) 10/01/2017   HGB 13.6 10/01/2017   HCT 40.6 10/01/2017   PLT 237 10/01/2017   LYMPHOPCT 9 09/30/2017   MONOPCT 9 09/30/2017   EOSPCT 1 09/30/2017   BASOPCT 1 09/30/2017    CMP:  Lab Results  Component Value Date   NA 135 09/30/2017   K 3.7 09/30/2017   CL 98 (L) 09/30/2017   CO2 28 09/30/2017   BUN 12 09/30/2017   CREATININE 0.94 09/30/2017   PROT 8.5 (H) 09/30/2017   ALBUMIN 4.6 09/30/2017   BILITOT 0.6 09/30/2017   ALKPHOS 85 09/30/2017   AST 32 09/30/2017   ALT 30 09/30/2017  .   Total Time in preparing paper work, data evaluation and todays exam - 35  minutes  Katha Hamming M.D on 10/02/2017 at 8:35 AM    Note: This dictation was prepared with Dragon dictation along with smaller phrase technology. Any transcriptional errors that result from this process are unintentional.

## 2017-10-02 NOTE — Care Management Note (Signed)
Case Management Note  Patient Details  Name: Eric Prince MRN: [id number] Date of Birth: 1985-10-31  Subjective/Objective:    Admitted to [facility] with the diagnosis of Asthma. States he hasn't seen a primary care physician in awhile. No pharmacy. No insurance. States he does work full time at [employer] x 4 months. Takes care of all basic activities of daily living himself, drives.  His telephone number is [phone]  Action/Plan: Gave [facility] and Medication Management application Will update Smiley Houseman   Expected Discharge Date:  10/02/17               Expected Discharge Plan:     In-House Referral:   yes  Discharge planning Services     Post Acute Care Choice:    Choice offered to:     DME Arranged:    DME Agency:     HH Arranged:    HH Agency:     Status of Service:     If discussed at [employer], dates discussed:    Additional Comments:   Gwenette Greet, RN MSN CCM Care Management [phone] 10/02/2017, 9:33 AM

## 2017-10-02 NOTE — Plan of Care (Signed)
  Problem: Education: Goal: Knowledge of General Education information will improve Outcome: Progressing   Problem: Health Behavior/Discharge Planning: Goal: Ability to manage health-related needs will improve Outcome: Progressing   Problem: Clinical Measurements: Goal: Ability to maintain clinical measurements within normal limits will improve Outcome: Progressing Goal: Respiratory complications will improve Outcome: Progressing   Problem: Coping: Goal: Level of anxiety will decrease Outcome: Progressing   Problem: Safety: Goal: Ability to remain free from injury will improve Outcome: Progressing   Problem: Skin Integrity: Goal: Risk for impaired skin integrity will decrease Outcome: Progressing

## 2017-10-03 LAB — BLOOD GAS, VENOUS
PATIENT TEMPERATURE: 37
PCO2 VEN: 59 mmHg (ref 44.0–60.0)
pH, Ven: 7.3 (ref 7.250–7.430)

## 2017-11-14 ENCOUNTER — Ambulatory Visit: Payer: Self-pay | Admitting: Family Medicine

## 2019-03-19 IMAGING — DX DG CHEST 1V PORT
1 series · 1 of 1 positions shown · non-contrast
Comparison: Chest x-ray of December 10, 2006

CLINICAL DATA: Two days of shortness of breath, cough, and
wheezing. The patient has run out of his asthma medicines. The
patient is a current smoker.

EXAM:
PORTABLE CHEST 1 VIEW

[chest ap]
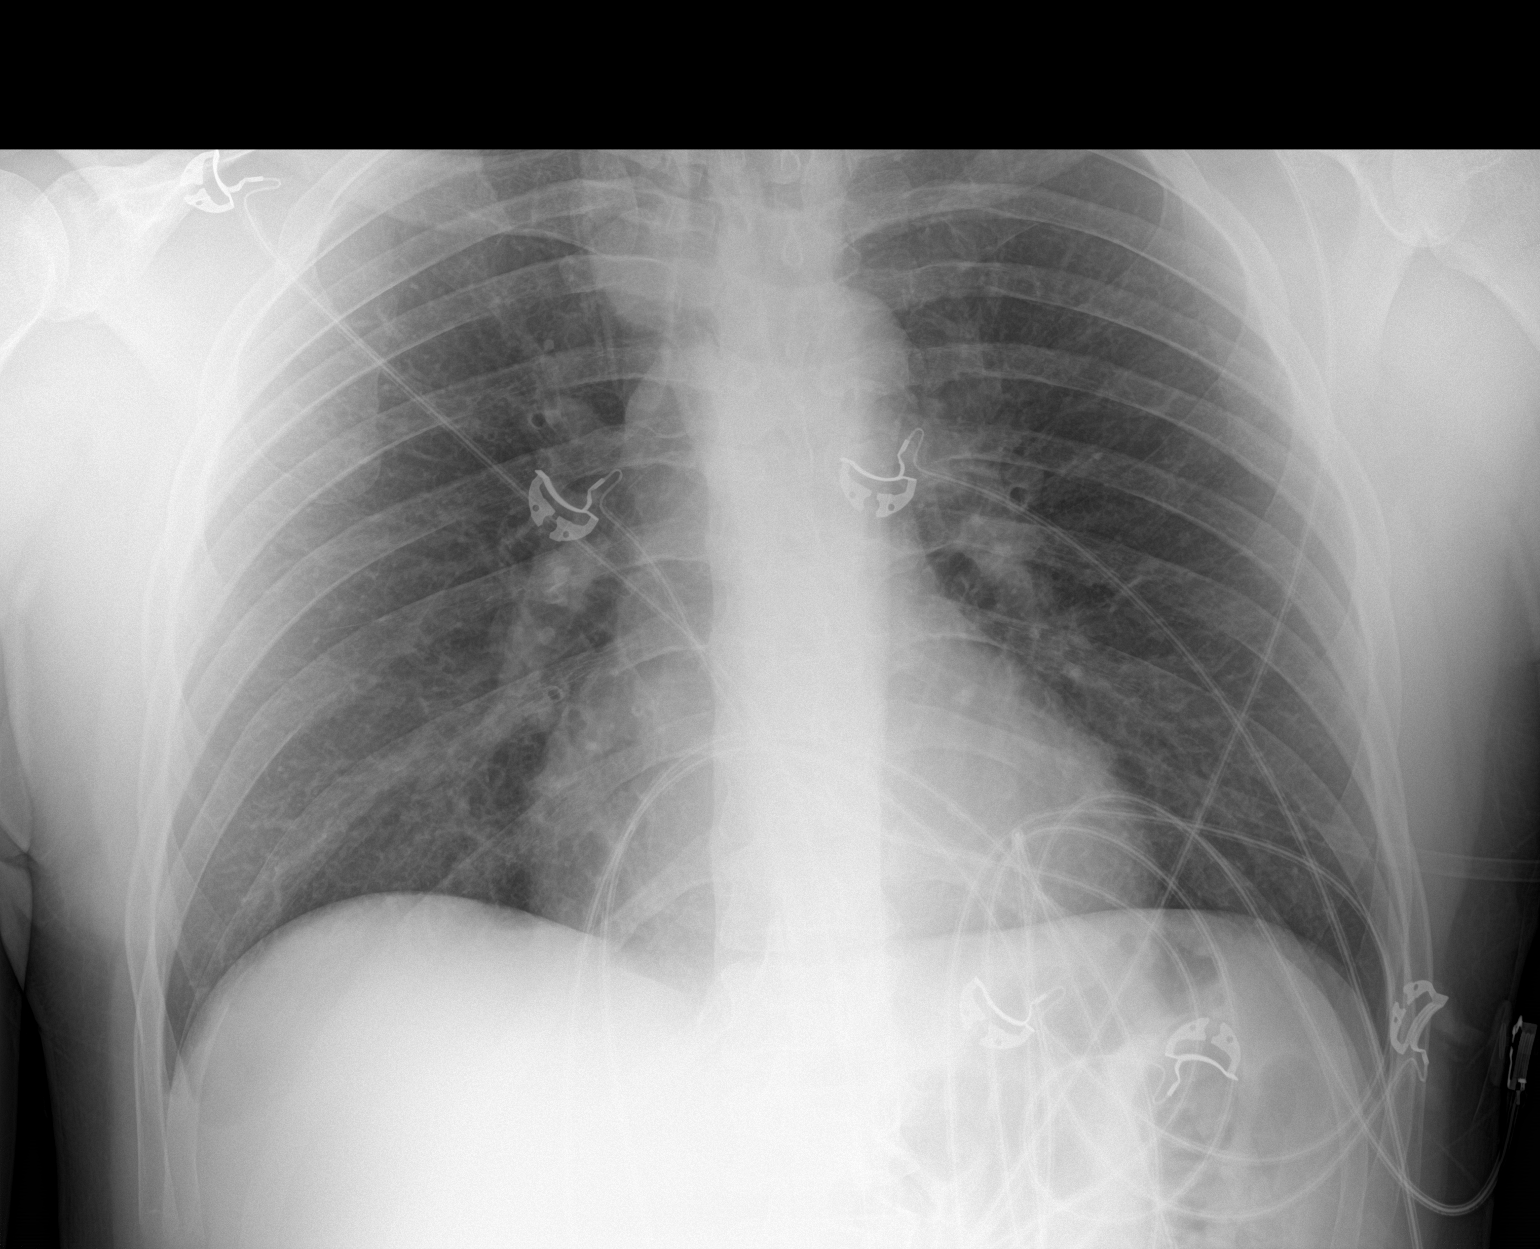

[1 of 1 positions shown; findings below may reference images not displayed]

FINDINGS: The lungs are well-expanded the interstitial markings are coarse.
There is no alveolar infiltrate. The heart and pulmonary vascularity
are normal. The mediastinum is normal in width. There is no pleural
effusion. The bony thorax exhibits no acute abnormality.
IMPRESSION: Mild interstitial marking prominence consistent with the history of
reactive airway disease and smoking. No alveolar pneumonia nor CHF.
